# Patient Record
Sex: Female | Born: 1965 | Race: White | Hispanic: No | Marital: Single | Smoking: Never smoker
Health system: Southern US, Community
[De-identification: ages and names within clinical notes are randomized; demographics above are authoritative.]

## PROBLEM LIST (undated history)

## (undated) DIAGNOSIS — G43909 Migraine, unspecified, not intractable, without status migrainosus: Secondary | ICD-10-CM

## (undated) DIAGNOSIS — F419 Anxiety disorder, unspecified: Secondary | ICD-10-CM

## (undated) DIAGNOSIS — I10 Essential (primary) hypertension: Secondary | ICD-10-CM

## (undated) HISTORY — PX: BLADDER SUSPENSION: SHX72

## (undated) HISTORY — DX: Essential (primary) hypertension: I10

## (undated) HISTORY — PX: ABDOMINAL HYSTERECTOMY: SHX81

## (undated) HISTORY — DX: Migraine, unspecified, not intractable, without status migrainosus: G43.909

## (undated) HISTORY — DX: Anxiety disorder, unspecified: F41.9

## (undated) HISTORY — PX: WISDOM TOOTH EXTRACTION: SHX21

---

## 2004-12-05 ENCOUNTER — Emergency Department: Payer: Self-pay | Admitting: General Practice

## 2008-02-03 ENCOUNTER — Emergency Department: Payer: Self-pay | Admitting: Emergency Medicine

## 2008-10-15 ENCOUNTER — Ambulatory Visit: Payer: Self-pay | Admitting: Obstetrics and Gynecology

## 2008-10-21 ENCOUNTER — Ambulatory Visit: Payer: Self-pay | Admitting: Obstetrics and Gynecology

## 2011-03-25 ENCOUNTER — Other Ambulatory Visit: Payer: Self-pay | Admitting: Gastroenterology

## 2011-04-12 ENCOUNTER — Ambulatory Visit: Payer: Self-pay | Admitting: Neurology

## 2015-12-16 ENCOUNTER — Emergency Department: Payer: Worker's Compensation

## 2015-12-16 ENCOUNTER — Encounter: Payer: Self-pay | Admitting: Emergency Medicine

## 2015-12-16 ENCOUNTER — Emergency Department
Admission: EM | Admit: 2015-12-16 | Discharge: 2015-12-16 | Disposition: A | Discharge status: Home / Self Care | Payer: Worker's Compensation | Attending: Emergency Medicine | Admitting: Emergency Medicine

## 2015-12-16 DIAGNOSIS — Y939 Activity, unspecified: Secondary | ICD-10-CM | POA: Diagnosis not present

## 2015-12-16 DIAGNOSIS — Z79899 Other long term (current) drug therapy: Secondary | ICD-10-CM | POA: Insufficient documentation

## 2015-12-16 DIAGNOSIS — W19XXXA Unspecified fall, initial encounter: Secondary | ICD-10-CM

## 2015-12-16 DIAGNOSIS — S4991XA Unspecified injury of right shoulder and upper arm, initial encounter: Secondary | ICD-10-CM | POA: Insufficient documentation

## 2015-12-16 DIAGNOSIS — W010XXA Fall on same level from slipping, tripping and stumbling without subsequent striking against object, initial encounter: Secondary | ICD-10-CM | POA: Insufficient documentation

## 2015-12-16 DIAGNOSIS — Y929 Unspecified place or not applicable: Secondary | ICD-10-CM | POA: Diagnosis not present

## 2015-12-16 DIAGNOSIS — Y990 Civilian activity done for income or pay: Secondary | ICD-10-CM | POA: Diagnosis not present

## 2015-12-16 MED ORDER — IBUPROFEN 800 MG PO TABS: 800.0000 mg | ORAL_TABLET | Freq: Three times a day (TID) | ORAL | 0 refills | Status: DC | PRN

## 2015-12-16 NOTE — ED Notes (Signed)
See triage note. Slipped and landed on right arm  Having pain to right forearm  Positive pulses

## 2015-12-16 NOTE — ED Provider Notes (Signed)
[facility] Emergency Department Provider Note  ____________________________________________  Time seen: Approximately 10:06 AM  I have reviewed the triage vital signs and the nursing notes.   HISTORY  Chief Complaint Arm Injury    HPI Audrey Stewart is a 50 y.o. female resents for evaluation of right arm injury. Patient reports that she fell at work landing on her right lower forearm. Complaining of pain there.   History reviewed. No pertinent past medical history.  There are no active problems to display for this patient.   History reviewed. No pertinent surgical history.  Prior to Admission medications   Medication Sig Start Date End Date Taking? Authorizing Provider  atenolol (TENORMIN) 25 MG tablet Take 25 mg by mouth daily.   Yes Historical Provider, MD  PARoxetine (PAXIL) 30 MG tablet Take 30 mg by mouth daily.   Yes Historical Provider, MD  ibuprofen (ADVIL,MOTRIN) 800 MG tablet Take 1 tablet (800 mg total) by mouth every 8 (eight) hours as needed. 12/16/15   Audrey Dakinharles M Devi Hopman, PA-C    Allergies Review of patient's allergies indicates no known allergies.  History reviewed. No pertinent family history.  Social History Social History  Substance Use Topics  . Smoking status: Never Smoker  . Smokeless tobacco: Never Used  . Alcohol use No    Review of Systems Constitutional: No fever/chills Musculoskeletal: Right forearm pain. Skin: Negative for rash. Neurological: Negative for headaches, focal weakness or numbness.  10-point ROS otherwise negative.  ____________________________________________   PHYSICAL EXAM:  VITAL SIGNS: ED Triage Vitals  Enc Vitals Group     BP 12/16/15 0956 (!) 146/77     Pulse Rate 12/16/15 0956 64     Resp 12/16/15 0956 20     Temp 12/16/15 0956 98.2 F (36.8 C)     Temp Source 12/16/15 0956 Oral     SpO2 12/16/15 0956 100 %     Weight 12/16/15 0946 170 lb (77.1 kg)     Height 12/16/15 0946 5\' 7"   (1.702 m)     Head Circumference --      Peak Flow --      Pain Score 12/16/15 0946 6     Pain Loc --      Pain Edu? --      Excl. in GC? --     Constitutional: Alert and oriented. Well appearing and in no acute distress. Musculoskeletal: Point tenderness over the lateral aspect of the forearm. No ecchymosis or bruising noted. Distally neurovascularly intact. Good strength. Full range of motion with pronation supination. Neurologic:  Normal speech and language. No gross focal neurologic deficits are appreciated. No gait instability. Skin:  Skin is warm, dry and intact. No rash noted. Psychiatric: Mood and affect are normal. Speech and behavior are normal.  ____________________________________________   LABS (all labs ordered are listed, but only abnormal results are displayed)  Labs Reviewed - No data to display ____________________________________________  EKG   ____________________________________________  RADIOLOGY  IMPRESSION: No fracture or dislocation. No apparent arthropathy. ____________________________________________   PROCEDURES  Procedure(s) performed: None  Critical Care performed: No  ____________________________________________   INITIAL IMPRESSION / ASSESSMENT AND PLAN / ED COURSE  Pertinent labs & imaging results that were available during my care of the patient were reviewed by me and considered in my medical decision making (see chart for details). Review of the [place] CSRS was performed in accordance of the [facility] prior to dispensing any controlled drugs.  Status post fall with right forearm contusion.  Rx given for Motrin 800 mg 3 times a day as needed for pain or discomfort. Return to work tomorrow.  Clinical Course    ____________________________________________   FINAL CLINICAL IMPRESSION(S) / ED DIAGNOSES  Final diagnoses:  Fall, initial encounter  Arm injury, right, initial encounter     This chart was dictated using voice  recognition software/Dragon. Despite best efforts to proofread, errors can occur which can change the meaning. Any change was purely unintentional.    Audrey Dakinharles M Goldy Calandra, PA-C 12/16/15 1058    Governor Rooksebecca Lord, MD 12/16/15 1620

## 2015-12-16 NOTE — ED Triage Notes (Signed)
Pt comes into the ED via EMS from work at [employer], states she slipped and landed on her right arm.. Pt c/o right forearm pain.. Pt arrived with a sling on ..Marland Kitchen

## 2020-01-17 ENCOUNTER — Ambulatory Visit
Admission: RE | Admit: 2020-01-17 | Discharge: 2020-01-17 | Disposition: A | Discharge status: Home / Self Care | Payer: 59 | Source: Ambulatory Visit | Attending: Emergency Medicine | Admitting: Emergency Medicine

## 2020-01-17 ENCOUNTER — Ambulatory Visit
Admission: EM | Admit: 2020-01-17 | Discharge: 2020-01-17 | Disposition: A | Discharge status: Home / Self Care | Payer: 59 | Attending: Emergency Medicine | Admitting: Emergency Medicine

## 2020-01-17 ENCOUNTER — Ambulatory Visit
Admission: RE | Admit: 2020-01-17 | Discharge: 2020-01-17 | Disposition: A | Discharge status: Home / Self Care | Payer: 59 | Attending: Emergency Medicine | Admitting: Emergency Medicine

## 2020-01-17 ENCOUNTER — Other Ambulatory Visit: Payer: Self-pay

## 2020-01-17 DIAGNOSIS — R0602 Shortness of breath: Secondary | ICD-10-CM

## 2020-01-17 DIAGNOSIS — R05 Cough: Secondary | ICD-10-CM | POA: Diagnosis not present

## 2020-01-17 DIAGNOSIS — U071 COVID-19: Secondary | ICD-10-CM | POA: Insufficient documentation

## 2020-01-17 DIAGNOSIS — J189 Pneumonia, unspecified organism: Secondary | ICD-10-CM

## 2020-01-17 DIAGNOSIS — R059 Cough, unspecified: Secondary | ICD-10-CM

## 2020-01-17 MED ORDER — AZITHROMYCIN 250 MG PO TABS: 250.0000 mg | ORAL_TABLET | Freq: Every day | ORAL | 0 refills | Status: AC

## 2020-01-17 NOTE — ED Triage Notes (Signed)
Patient reports she tested positive for COVID with an at home rapid test; agreeable to having a send off today. Reports she feels the chest congestion is getting worse.

## 2020-01-17 NOTE — Discharge Instructions (Signed)
Go to [facility] for your chest xray.  I will call you with the results afterward.    Go to the Emergency Department if you have acute worsening symptoms, including shortness of breath.

## 2020-01-17 NOTE — ED Provider Notes (Signed)
Audrey Stewart    CSN: [id number] Arrival date & time: 01/17/20  1029      History   Chief Complaint Chief Complaint  Patient presents with  . Covid Positive  . Chest Congestion    HPI Audrey Stewart is a 54 y.o. female.   Patient presents with nonproductive cough and shortness of breath x2 weeks.  She reports a positive COVID test at home on January 07, 2020.  She feels like her chest congestion is worsening.  She denies fever, chills, sore throat, vomiting, diarrhea, or other symptoms.  Treatment attempted at home with [facility] and Robitussin.  The history is provided by the patient.    Past Medical History:  Diagnosis Date  . Anxiety   . Hypertension   . Migraine     There are no problems to display for this patient.   Past Surgical History:  Procedure Laterality Date  . ABDOMINAL HYSTERECTOMY    . BLADDER SUSPENSION    . WISDOM TOOTH EXTRACTION      OB History   No obstetric history on file.      Home Medications    Prior to Admission medications   Medication Sig Start Date End Date Taking? Authorizing Provider  atenolol (TENORMIN) 25 MG tablet Take 25 mg by mouth daily.   Yes [provider]  PARoxetine (PAXIL) 30 MG tablet Take 30 mg by mouth daily.   Yes [provider]  azithromycin (ZITHROMAX) 250 MG tablet Take 1 tablet (250 mg total) by mouth daily. Take first 2 tablets together, then 1 every day until finished. 01/17/20   Mickie Bail, NP  ibuprofen (ADVIL,MOTRIN) 800 MG tablet Take 1 tablet (800 mg total) by mouth every 8 (eight) hours as needed. 12/16/15   Beers, Charmayne Sheer, PA-C    Family History History reviewed. No pertinent family history.  Social History Social History   Tobacco Use  . Smoking status: Never Smoker  . Smokeless tobacco: Never Used  Substance Use Topics  . Alcohol use: No  . Drug use: No     Allergies   Patient has no known allergies.   Review of Systems Review of Systems    Constitutional: Negative for chills and fever.  HENT: Negative for ear pain and sore throat.   Eyes: Negative for pain and visual disturbance.  Respiratory: Positive for cough and shortness of breath.   Cardiovascular: Negative for chest pain and palpitations.  Gastrointestinal: Negative for abdominal pain, diarrhea and vomiting.  Genitourinary: Negative for dysuria and hematuria.  Musculoskeletal: Negative for arthralgias and back pain.  Skin: Negative for color change and rash.  Neurological: Negative for seizures and syncope.  All other systems reviewed and are negative.    Physical Exam Triage Vital Signs ED Triage Vitals  Enc Vitals Group     BP      Pulse      Resp      Temp      Temp src      SpO2      Weight      Height      Head Circumference      Peak Flow      Pain Score      Pain Loc      Pain Edu?      Excl. in GC?    No data found.  Updated Vital Signs BP (!) 149/89   Pulse 67   Temp 98.9 F (37.2 C)  Resp 20   SpO2 95%   Visual Acuity Right Eye Distance:   Left Eye Distance:   Bilateral Distance:    Right Eye Near:   Left Eye Near:    Bilateral Near:     Physical Exam Vitals and nursing note reviewed.  Constitutional:      General: She is not in acute distress.    Appearance: She is well-developed.  HENT:     Head: Normocephalic and atraumatic.     Mouth/Throat:     Mouth: Mucous membranes are moist.     Pharynx: Oropharynx is clear.  Eyes:     Conjunctiva/sclera: Conjunctivae normal.  Cardiovascular:     Rate and Rhythm: Normal rate and regular rhythm.     Heart sounds: No murmur heard.   Pulmonary:     Effort: Pulmonary effort is normal. No respiratory distress.     Breath sounds: Normal breath sounds. No wheezing or rhonchi.  Abdominal:     Palpations: Abdomen is soft.     Tenderness: There is no abdominal tenderness. There is no guarding or rebound.  Musculoskeletal:     Cervical back: Neck supple.  Skin:    General:  Skin is warm and dry.     Findings: No rash.  Neurological:     General: No focal deficit present.     Mental Status: She is alert and oriented to person, place, and time.     Gait: Gait normal.  Psychiatric:        Mood and Affect: Mood normal.        Behavior: Behavior normal.      UC Treatments / Results  Labs (all labs ordered are listed, but only abnormal results are displayed) Labs Reviewed  NOVEL CORONAVIRUS, NAA    EKG   Radiology DG Chest 2 View  Result Date: 01/17/2020 CLINICAL DATA:  COVID positive.  Cough. EXAM: CHEST - 2 VIEW COMPARISON:  None. FINDINGS: Lung volume normal. Possible subtle early areas of airspace disease right upper lobe and left lung base. No pleural effusion. Cardiac and mediastinal contours normal. Skeletal structures normal. IMPRESSION: Question early subtle areas of airspace disease bilaterally which could be due to COVID pneumonia. Electronically Signed   By: Marlan Palau M.D.   On: 01/17/2020 11:45    Procedures Procedures (including critical care time)  Medications Ordered in UC Medications - No data to display  Initial Impression / Assessment and Plan / UC Course  I have reviewed the triage vital signs and the nursing notes.  Pertinent labs & imaging results that were available during my care of the patient were reviewed by me and considered in my medical decision making (see chart for details).   COVID-19.  Early pneumonia.  Chest xray "Possible subtle early areas of airspace disease right upper lobe and left lung base.... Question early subtle areas of airspace disease bilaterally which could be due to COVID pneumonia."  Treating with Zithromax.  Strict instruction for going to the ED discussed with patient, including increased SOB.  Instructed patient to follow up with her PCP in 1-2 weeks for a recheck.  Patient agrees to plan of care.     Final Clinical Impressions(s) / UC Diagnoses   Final diagnoses:  COVID-19  Cough    Shortness of breath  Community acquired pneumonia, unspecified laterality     Discharge Instructions     Go to [facility] for your chest xray.  I will call you with the  results afterward.    Go to the Emergency Department if you have acute worsening symptoms, including shortness of breath.        ED Prescriptions    Medication Sig Dispense Auth. Provider   azithromycin (ZITHROMAX) 250 MG tablet Take 1 tablet (250 mg total) by mouth daily. Take first 2 tablets together, then 1 every day until finished. 6 tablet Mickie Bail, NP     PDMP not reviewed this encounter.   Mickie Bail, NP 01/17/20 [phone]

## 2020-01-19 LAB — NOVEL CORONAVIRUS, NAA: SARS-CoV-2, NAA: DETECTED — AB

## 2020-01-19 LAB — SARS-COV-2, NAA 2 DAY TAT

## 2020-01-20 ENCOUNTER — Emergency Department: Payer: 59

## 2020-01-20 ENCOUNTER — Encounter: Payer: Self-pay | Admitting: Emergency Medicine

## 2020-01-20 ENCOUNTER — Emergency Department
Admission: EM | Admit: 2020-01-20 | Discharge: 2020-01-20 | Disposition: A | Discharge status: Home / Self Care | Payer: 59 | Attending: Emergency Medicine | Admitting: Emergency Medicine

## 2020-01-20 ENCOUNTER — Other Ambulatory Visit: Payer: Self-pay

## 2020-01-20 DIAGNOSIS — I10 Essential (primary) hypertension: Secondary | ICD-10-CM | POA: Insufficient documentation

## 2020-01-20 DIAGNOSIS — R059 Cough, unspecified: Secondary | ICD-10-CM

## 2020-01-20 DIAGNOSIS — R0602 Shortness of breath: Secondary | ICD-10-CM

## 2020-01-20 DIAGNOSIS — U071 COVID-19: Secondary | ICD-10-CM | POA: Diagnosis not present

## 2020-01-20 DIAGNOSIS — R432 Parageusia: Secondary | ICD-10-CM

## 2020-01-20 DIAGNOSIS — R05 Cough: Secondary | ICD-10-CM | POA: Diagnosis present

## 2020-01-20 DIAGNOSIS — Z79899 Other long term (current) drug therapy: Secondary | ICD-10-CM | POA: Diagnosis not present

## 2020-01-20 LAB — BASIC METABOLIC PANEL
Anion gap: 13 (ref 5–15)
BUN: 14 mg/dL (ref 6–20)
CO2: 25 mmol/L (ref 22–32)
Calcium: 8.5 mg/dL — ABNORMAL LOW (ref 8.9–10.3)
Chloride: 100 mmol/L (ref 98–111)
Creatinine, Ser: 0.87 mg/dL (ref 0.44–1.00)
GFR calc Af Amer: >60 mL/min (ref >60)
GFR calc non Af Amer: >60 mL/min (ref >60)
Glucose, Bld: 123 mg/dL — ABNORMAL HIGH (ref 70–99)
Potassium: 3.4 mmol/L — ABNORMAL LOW (ref 3.5–5.1)
Sodium: 138 mmol/L (ref 135–145)

## 2020-01-20 LAB — CBC
HCT: 38.6 % (ref 36.0–46.0)
Hemoglobin: 13.3 g/dL (ref 12.0–15.0)
MCH: 29.6 pg (ref 26.0–34.0)
MCHC: 34.5 g/dL (ref 30.0–36.0)
MCV: 86 fL (ref 80.0–100.0)
Platelets: 276 10*3/uL (ref 150–400)
RBC: 4.49 MIL/uL (ref 3.87–5.11)
RDW: 12.6 % (ref 11.5–15.5)
WBC: 8.9 10*3/uL (ref 4.0–10.5)
nRBC: 0 % (ref 0.0–0.2)

## 2020-01-20 LAB — TROPONIN I (HIGH SENSITIVITY)
Troponin I (High Sensitivity): 5 ng/L (ref <18)
Troponin I (High Sensitivity): 3 ng/L (ref <18)

## 2020-01-20 MED ORDER — IOHEXOL 350 MG/ML SOLN
75.0000 mL | Freq: Once | INTRAVENOUS | Status: AC | PRN
  Administered 2020-01-20: 75 mL via INTRAVENOUS

## 2020-01-20 MED ORDER — SODIUM CHLORIDE 0.9 % IV BOLUS
1000.0000 mL | Freq: Once | INTRAVENOUS | Status: AC
  Administered 2020-01-20: 1000 mL via INTRAVENOUS

## 2020-01-20 NOTE — ED Triage Notes (Addendum)
Pt arrived via POV with reports of shortness of breath, cough and congestion. Pt reports she did a home COVID test on 9/6 that was positive, was seen Thursday dx with PNA and had another test that was positive.  Pt states she is having trouble laying down due to cough and shortness of breath.  Pt reports she has been running fevers but does not remember how high but states she has been taking tylenol.  Pt currently taking zpak and has 2 doses left

## 2020-01-20 NOTE — Discharge Instructions (Addendum)
Please return to the emergency department for any episodes of hypoxia (oxygen level<88%)

## 2020-01-20 NOTE — ED Provider Notes (Signed)
[facility] Emergency Department Provider Note   ____________________________________________   First MD Initiated Contact with Patient 01/20/20 1122     (approximate)  I have reviewed the triage vital signs and the nursing notes.   HISTORY  Chief Complaint Cough, Nasal Congestion, and Shortness of Breath    HPI Audrey Stewart is a 54 y.o. female with a stated past medical history of anxiety, hypertension, and migraines who presents for worsening shortness of breath in the setting of of COVID-19 infection.  Patient states that she took a home Covid test on 9/6 that was positive and then seen on the subsequent Thursday and diagnosed with pneumonia and another positive test.  Patient states that she is having worsening shortness of breath over the last 10 days that is worsened when laying down flat and partially relieved by ice sitting or standing.  Patient also endorses associated fevers for which she has been taking Tylenol.  Patient also states that she has taken a 5-day course of prednisone taper as well as currently taking a Z-Pak with 2 doses left.         Past Medical History:  Diagnosis Date  . Anxiety   . Hypertension   . Migraine     There are no problems to display for this patient.   Past Surgical History:  Procedure Laterality Date  . ABDOMINAL HYSTERECTOMY    . BLADDER SUSPENSION    . WISDOM TOOTH EXTRACTION      Prior to Admission medications   Medication Sig Start Date End Date Taking? Authorizing Provider  atenolol (TENORMIN) 25 MG tablet Take 25 mg by mouth daily.    [provider]  azithromycin (ZITHROMAX) 250 MG tablet Take 1 tablet (250 mg total) by mouth daily. Take first 2 tablets together, then 1 every day until finished. 01/17/20   Mickie Bail, NP  ibuprofen (ADVIL,MOTRIN) 800 MG tablet Take 1 tablet (800 mg total) by mouth every 8 (eight) hours as needed. 12/16/15   Beers, Charmayne Sheer, PA-C  PARoxetine (PAXIL)  30 MG tablet Take 30 mg by mouth daily.    [provider]    Allergies Scopolamine  No family history on file.  Social History Social History   Tobacco Use  . Smoking status: Never Smoker  . Smokeless tobacco: Never Used  Substance Use Topics  . Alcohol use: No  . Drug use: No    Review of Systems Constitutional: Endorses fever/chills Eyes: No visual changes. ENT: Endorses sore throat. Cardiovascular: Endorses chest pain. Respiratory: Endorses shortness of breath and cough Gastrointestinal: No abdominal pain.  No nausea, no vomiting.  No diarrhea. Genitourinary: Negative for dysuria. Musculoskeletal: Negative for acute arthralgias Skin: Negative for rash. Neurological: Negative for headaches, weakness/numbness/paresthesias in any extremity Psychiatric: Negative for suicidal ideation/homicidal ideation   ____________________________________________   PHYSICAL EXAM:  VITAL SIGNS: ED Triage Vitals  Enc Vitals Group     BP 01/20/20 0526 124/64     Pulse Rate 01/20/20 0526 82     Resp 01/20/20 0526 18     Temp 01/20/20 0526 98.5 F (36.9 C)     Temp Source 01/20/20 0526 Oral     SpO2 01/20/20 0526 97 %     Weight 01/20/20 0532 160 lb (72.6 kg)     Height 01/20/20 0532 5\' 6"  (1.676 m)     Head Circumference --      Peak Flow --      Pain Score 01/20/20 0532 8  Pain Loc --      Pain Edu? --      Excl. in GC? --    Constitutional: Alert and oriented. Well appearing and in no acute distress. Eyes: Conjunctivae are normal. PERRL. EOMI. Head: Atraumatic. Nose: No congestion/rhinnorhea. Mouth/Throat: Mucous membranes are moist.  Erythematous posterior oropharynx Neck: No stridor Cardiovascular: Normal rate, regular rhythm. Grossly normal heart sounds.  Good peripheral circulation. Respiratory: Rhonchi over bilateral lung fields, mildly tachypneic.  Normal respiratory effort.  No retractions. Gastrointestinal: Soft and nontender. No  distention. Musculoskeletal: No lower extremity tenderness nor edema.  No joint effusions. Neurologic:  Normal speech and language. No gross focal neurologic deficits are appreciated. Skin:  Skin is warm and dry. No rash noted. Psychiatric: Mood and affect are normal. Speech and behavior are normal.  ____________________________________________   LABS (all labs ordered are listed, but only abnormal results are displayed)  Labs Reviewed  BASIC METABOLIC PANEL - Abnormal; Notable for the following components:      Result Value   Potassium 3.4 (*)    Glucose, Bld 123 (*)    Calcium 8.5 (*)    All other components within normal limits  CBC  TROPONIN I (HIGH SENSITIVITY)  TROPONIN I (HIGH SENSITIVITY)   ____________________________________________  EKG  ED ECG REPORT I, Merwyn Katos, the attending physician, personally viewed and interpreted this ECG.  Date: 01/20/2020 EKG Time: 523 Rate: 73 Rhythm: normal sinus rhythm QRS Axis: normal Intervals: normal ST/T Wave abnormalities: normal Narrative Interpretation: no evidence of acute ischemia  ____________________________________________  RADIOLOGY  ED MD interpretation: CT angiography of the chest as well as 2 view x-ray shows scattered bibasilar opacities concerning for multifocal pneumonia likely related to Covid.  No evidence of pulmonary emboli  Official radiology report(s): DG Chest 2 View  Result Date: 01/20/2020 CLINICAL DATA:  COVID-19 EXAM: CHEST - 2 VIEW COMPARISON:  01/17/2020 FINDINGS: Worsened multifocal bilateral peripheral predominant airspace opacities. No pleural effusion. No pneumothorax. Normal cardiomediastinal contours. IMPRESSION: Worsened multifocal bilateral peripheral predominant airspace opacities, compatible with multifocal infection. Electronically Signed   By: Deatra Robinson M.D.   On: 01/20/2020 06:35    ____________________________________________   PROCEDURES  Procedure(s) performed  (including Critical Care):  Procedures   ____________________________________________   INITIAL IMPRESSION / ASSESSMENT AND PLAN / ED COURSE        Presentation most consistent with Viral Syndrome.  Patient has tested positive for COVID-19. Based on vitals and exam they are not hypoxic, nontoxic and stable for discharge.  Given History and Exam I have a lower suspicion for: Emergent CardioPulmonary causes [such as Acute Asthma or COPD Exacerbation, acute Heart Failure or exacerbation, PE, PTX, atypical ACS, PNA]. Emergent Otolaryngeal causes [such as PTA, RPA, Ludwigs, Epiglottitis, EBV].  Regarding Emergent Travel or Immunosuppressive related infectious: I have a low suspicion for acute HIV.  Will provide strict return precautions and instructions on self-isolation/quarantine and anticipatory guidance.      ____________________________________________   FINAL CLINICAL IMPRESSION(S) / ED DIAGNOSES  Final diagnoses:  None     ED Discharge Orders    None       Note:  This document was prepared using Dragon voice recognition software and may include unintentional dictation errors.   Merwyn Katos, MD 01/20/20 1446

## 2020-01-20 NOTE — ED Notes (Signed)
Family updated. Pending CTA results.

## 2020-01-21 ENCOUNTER — Telehealth: Payer: Self-pay | Admitting: Physician Assistant

## 2020-01-21 NOTE — Telephone Encounter (Signed)
  Called to discuss with patient about Covid symptoms and the use of casirivimab/imdevimab, a monoclonal antibody infusion for those with mild to moderate Covid symptoms and at a high risk of hospitalization.    Message left to call back our hotline [phone] and sent my chart message.   Deone Omahoney, PA - C 

## 2021-07-03 ENCOUNTER — Emergency Department
Admission: EM | Admit: 2021-07-03 | Discharge: 2021-07-03 | Disposition: A | Discharge status: Home / Self Care | Payer: 59 | Attending: Emergency Medicine | Admitting: Emergency Medicine

## 2021-07-03 ENCOUNTER — Encounter: Payer: Self-pay | Admitting: Emergency Medicine

## 2021-07-03 ENCOUNTER — Emergency Department: Payer: 59

## 2021-07-03 ENCOUNTER — Other Ambulatory Visit: Payer: Self-pay

## 2021-07-03 DIAGNOSIS — Z79899 Other long term (current) drug therapy: Secondary | ICD-10-CM | POA: Diagnosis not present

## 2021-07-03 DIAGNOSIS — R079 Chest pain, unspecified: Secondary | ICD-10-CM | POA: Diagnosis present

## 2021-07-03 DIAGNOSIS — K2900 Acute gastritis without bleeding: Secondary | ICD-10-CM | POA: Insufficient documentation

## 2021-07-03 DIAGNOSIS — I10 Essential (primary) hypertension: Secondary | ICD-10-CM | POA: Insufficient documentation

## 2021-07-03 LAB — TROPONIN I (HIGH SENSITIVITY)
Troponin I (High Sensitivity): 3 ng/L (ref <18)
Troponin I (High Sensitivity): 4 ng/L (ref <18)

## 2021-07-03 LAB — BASIC METABOLIC PANEL
Anion gap: 10 (ref 5–15)
BUN: 14 mg/dL (ref 6–20)
CO2: 29 mmol/L (ref 22–32)
Calcium: 9.7 mg/dL (ref 8.9–10.3)
Chloride: 99 mmol/L (ref 98–111)
Creatinine, Ser: 0.94 mg/dL (ref 0.44–1.00)
GFR, Estimated: >60 mL/min (ref >60)
Glucose, Bld: 86 mg/dL (ref 70–99)
Potassium: 3.5 mmol/L (ref 3.5–5.1)
Sodium: 138 mmol/L (ref 135–145)

## 2021-07-03 LAB — CBC
HCT: 43.1 % (ref 36.0–46.0)
Hemoglobin: 14.1 g/dL (ref 12.0–15.0)
MCH: 29 pg (ref 26.0–34.0)
MCHC: 32.7 g/dL (ref 30.0–36.0)
MCV: 88.7 fL (ref 80.0–100.0)
Platelets: 307 10*3/uL (ref 150–400)
RBC: 4.86 MIL/uL (ref 3.87–5.11)
RDW: 12.1 % (ref 11.5–15.5)
WBC: 9 10*3/uL (ref 4.0–10.5)
nRBC: 0 % (ref 0.0–0.2)

## 2021-07-03 MED ORDER — FAMOTIDINE 20 MG PO TABS: 20.0000 mg | ORAL_TABLET | Freq: Two times a day (BID) | ORAL | 0 refills | Status: AC

## 2021-07-03 NOTE — ED Triage Notes (Signed)
Pt in via POV, reports recent new medication for hypertension, reports has been keeping a log at home, today BP 179/105, also reports left arm pain, some intermittent chest pain as well with radiation to jaw.   ? ?Sent over per [facility] for further eval.  NAD noted at this time.  ?

## 2021-07-03 NOTE — ED Provider Triage Note (Signed)
Emergency Medicine Provider Triage Evaluation Note ? ?Audrey Stewart , a 56 y.o. female  was evaluated in triage.  Pt complains of blurred vision and left arm pain plus hypertension. She was taken off Atenolol due to decreased energy and started Lisinopril about a month ago. Has been keeping a log of BP readings and has been hypertensive according to her home machine. ? ?Review of Systems  ?Positive: Left arm pain, blurred vision ?Negative: Chest pain, shortness of breath ? ?Physical Exam  ?BP 135/81 (BP Location: Left Arm)   Pulse 61   Resp 16   SpO2 98%  ?Gen:   Awake, no distress   ?Resp:  Normal effort  ?MSK:   Moves extremities without difficulty  ?Other:   ? ?Medical Decision Making  ?Medically screening exam initiated at 2:31 PM.  Appropriate orders placed.  Audrey Stewart was informed that the remainder of the evaluation will be completed by another provider, this initial triage assessment does not replace that evaluation, and the importance of remaining in the ED until their evaluation is complete. ? ?  ?Chinita Pester, FNP ?07/03/21 1436 ? ?

## 2021-07-03 NOTE — ED Triage Notes (Signed)
Sent from [facility] for ED evaluation.  Patient C?O CP on Wendesday night that returned today.  Also c/o left arm pain and elevated BP.  BP  150's/80 at [facility].  AAOx3.  Skin warm and dry. NAD ?

## 2021-07-03 NOTE — ED Provider Notes (Signed)
? ?[facility] ?Provider Note ? ? ? Event Date/Time  ? First MD Initiated Contact with Patient 07/03/21 1752   ?  (approximate) ? ? ?History  ? ?Hypertension ? ? ?HPI ? ?Audrey Stewart is a 56 y.o. female with a past history of hypertension and migraines and anxiety who comes ED complaining of intermittent chest pain that radiates up to the jaw and left arm.  Not exertional, not pleuritic, no aggravating or alleviating factors.  Not worse with eating.  No fever or shortness of breath.  No diaphoresis or vomiting.  No black or bloody stool.  Went to urgent care for evaluation but they sent the patient to the ED. ? ?She does note that with blood pressure, she was recently transition from atenolol to 10 mg lisinopril by her gynecologist, and blood pressure has been running higher since then.  They did send a prescription to increase lisinopril to 20 mg today which she has not picked up yet. ?  ? ? ?Physical Exam  ? ?Triage Vital Signs: ?ED Triage Vitals  ?Enc Vitals Group  ?   BP 07/03/21 1428 135/81  ?   Pulse Rate 07/03/21 1428 61  ?   Resp 07/03/21 1428 16  ?   Temp 07/03/21 1428 98 ?F (36.7 ?C)  ?   Temp Source 07/03/21 1428 Oral  ?   SpO2 07/03/21 1428 98 %  ?   Weight 07/03/21 1432 173 lb (78.5 kg)  ?   Height 07/03/21 1432 5\' 6"  (1.676 m)  ?   Head Circumference --   ?   Peak Flow --   ?   Pain Score 07/03/21 1431 3  ?   Pain Loc --   ?   Pain Edu? --   ?   Excl. in GC? --   ? ? ?Most recent vital signs: ?Vitals:  ? 07/03/21 1428 07/03/21 1805  ?BP: 135/81 (!) 165/89  ?Pulse: 61 (!) 56  ?Resp: 16 17  ?Temp: 98 ?F (36.7 ?C)   ?SpO2: 98% 100%  ? ? ? ?General: Awake, no distress.  ?CV:  Good peripheral perfusion.  Regular rate and rhythm.  Symmetric peripheral pulses ?Resp:  Normal effort.  Clear to auscultation bilaterally ?Abd:  No distention.  Mild left upper quadrant tenderness reproducing her pain.  No peritoneal signs.  Abdomen is soft ?Other:  No lower extremity edema or calf swelling  or calf tenderness. ? ? ?ED Results / Procedures / Treatments  ? ?Labs ?(all labs ordered are listed, but only abnormal results are displayed) ?Labs Reviewed  ?BASIC METABOLIC PANEL  ?CBC  ?TROPONIN I (HIGH SENSITIVITY)  ?TROPONIN I (HIGH SENSITIVITY)  ? ? ? ?EKG ? ?Interpreted by me ?Normal sinus rhythm rate of 61.  Normal axis intervals QRS ST segments and T waves.  No evidence of right heart strain or ischemic changes. ? ? ?RADIOLOGY ?Chest x-ray viewed and interpreted by me, appears normal.  Radiology report reviewed. ? ? ? ?PROCEDURES: ? ?Critical Care performed: No ? ?Procedures ? ? ?MEDICATIONS ORDERED IN ED: ?Medications - No data to display ? ? ?IMPRESSION / MDM / ASSESSMENT AND PLAN / ED COURSE  ?I reviewed the triage vital signs and the nursing notes. ?             ?               ? ?Differential diagnosis includes, but is not limited to, GERD, non-STEMI, anxiety, pneumonia ? ? ? ?  Patient presents with atypical chest pain, has normal vital signs, normal labs and EKG and chest x-ray.  Exam is consistent with gastritis.  We will start her on Pepcid, agree with increasing lisinopril which she will pick up today.  Not requiring admission with atypical symptoms and reassuring work-up.  Doubt ACS PE dissection pericarditis or mediastinitis.  Stable for discharge. ?  ? ? ?FINAL CLINICAL IMPRESSION(S) / ED DIAGNOSES  ? ?Final diagnoses:  ?Acute gastritis without hemorrhage, unspecified gastritis type  ?Hypertension, unspecified type  ? ? ? ?Rx / DC Orders  ? ?ED Discharge Orders   ? ?      Ordered  ?  famotidine (PEPCID) 20 MG tablet  2 times daily       ? 07/03/21 1909  ? ?  ?  ? ?  ? ? ? ?Note:  This document was prepared using Dragon voice recognition software and may include unintentional dictation errors. ?  ?Sharman Cheek, MD ?07/03/21 1915 ? ?

## 2022-03-03 IMAGING — CT CT ANGIO CHEST
2 of 6 series · 18 of 46 positions shown · IV contrast (APPLIED)
Comparison: None.

CLINICAL DATA: Shortness of breath. FXURI-ZA positive. Cough and
congestion.

EXAM:
CT ANGIOGRAPHY CHEST WITH CONTRAST
TECHNIQUE: Multidetector CT imaging of the chest was performed using the
standard protocol during bolus administration of intravenous
contrast. Multiplanar CT image reconstructions and MIPs were
obtained to evaluate the vascular anatomy.
CONTRAST:  75mL OMNIPAQUE IOHEXOL 350 MG/ML SOLN

[Series 5: thins · axial · 0.72mm/px · z∈[-649,-399]mm · 15 of 274 slices shown]
[im 12/274  lung]
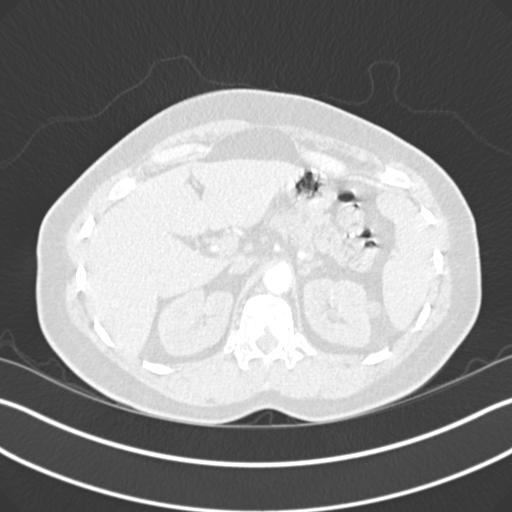
[im 36/274  soft-tissue]
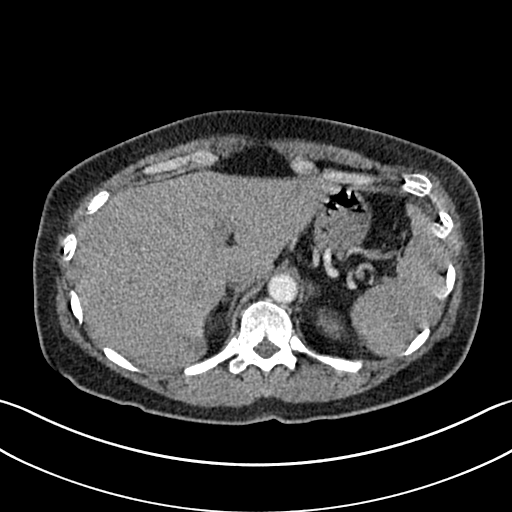
[im 48/274  lung]
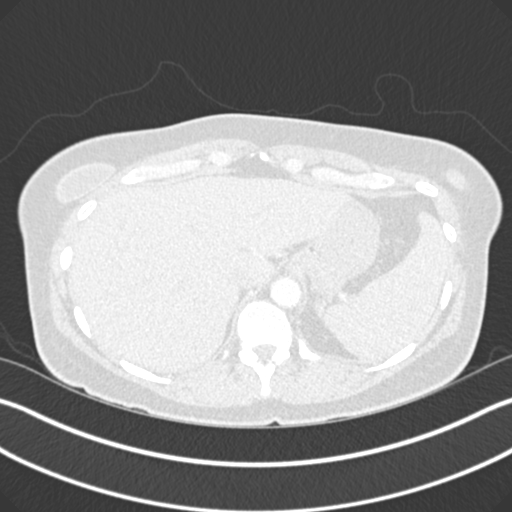
[im 72/274  soft-tissue]
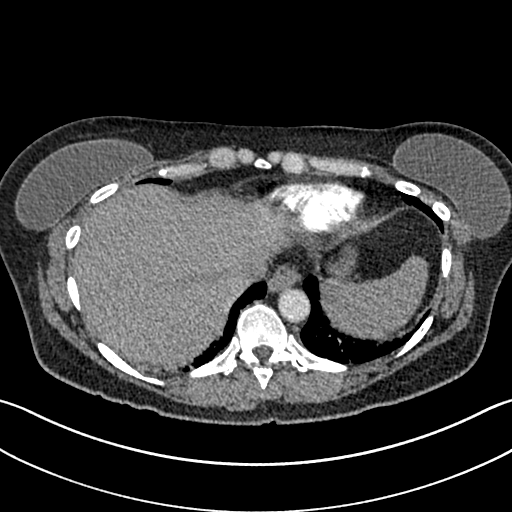
[im 84/274  lung]
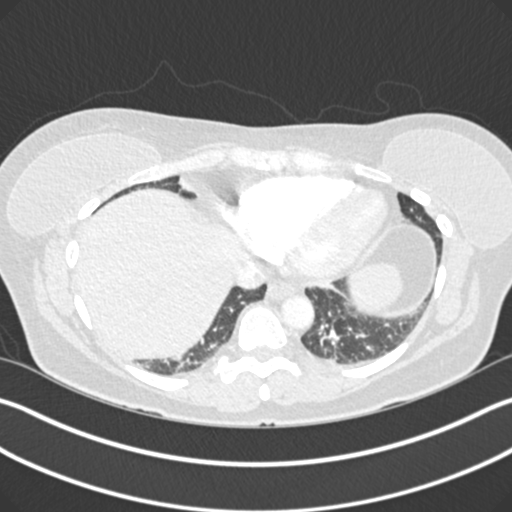
[im 107/274  soft-tissue]
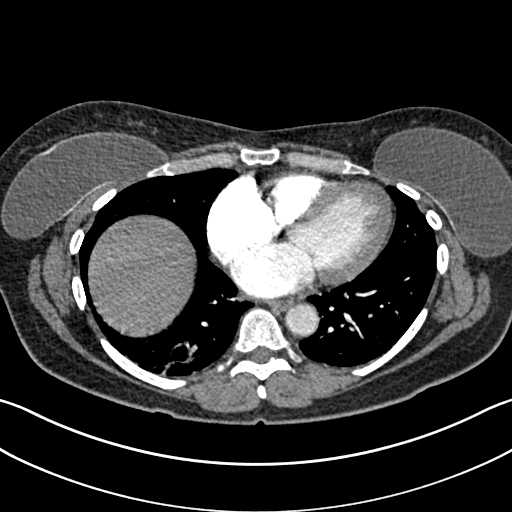
[im 119/274  lung]
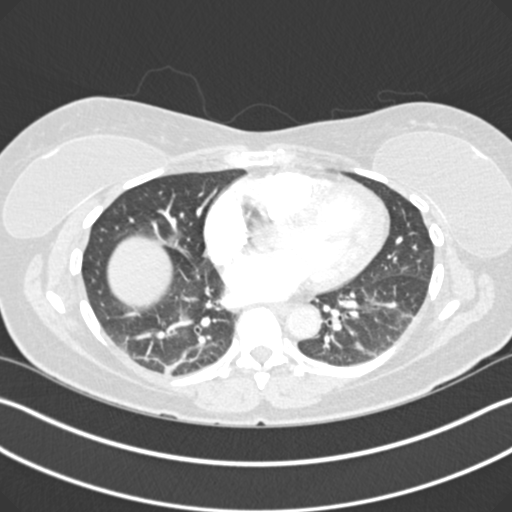
[im 143/274  soft-tissue]
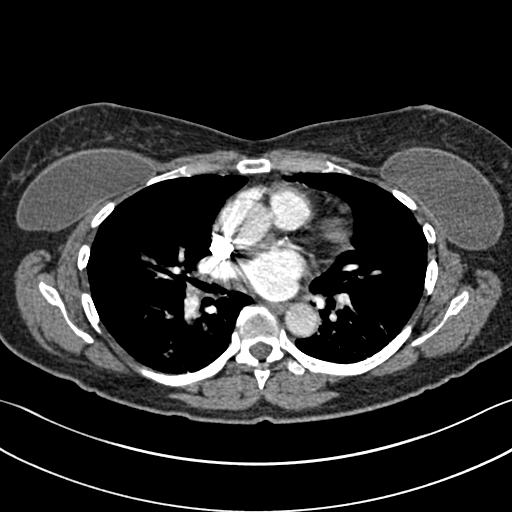
[im 155/274  lung]
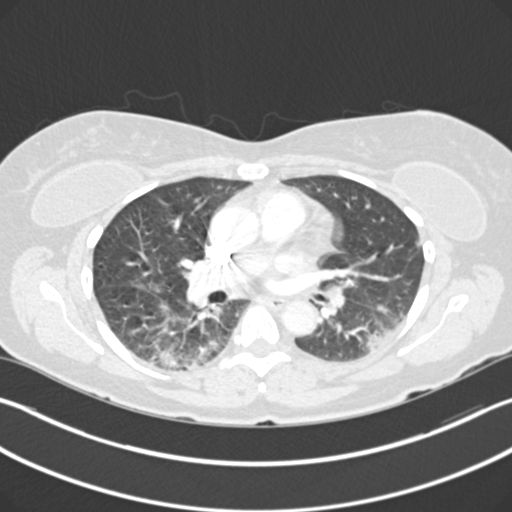
[im 167/274  soft-tissue]
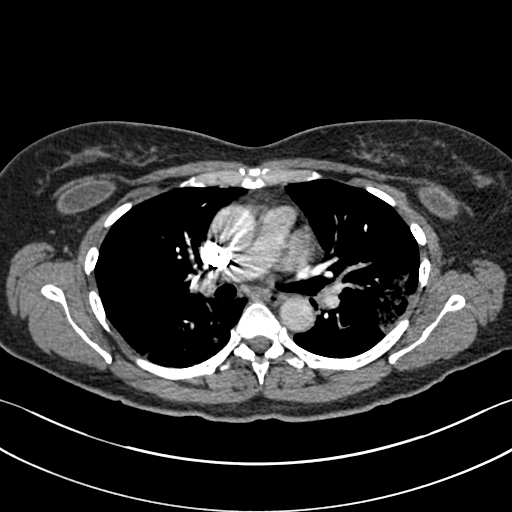
[im 190/274  lung]
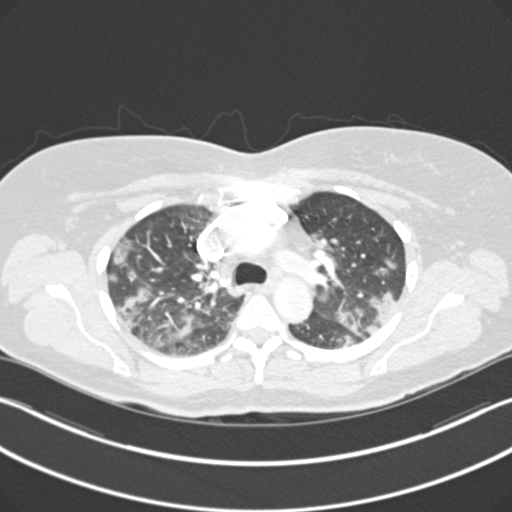
[im 202/274  soft-tissue]
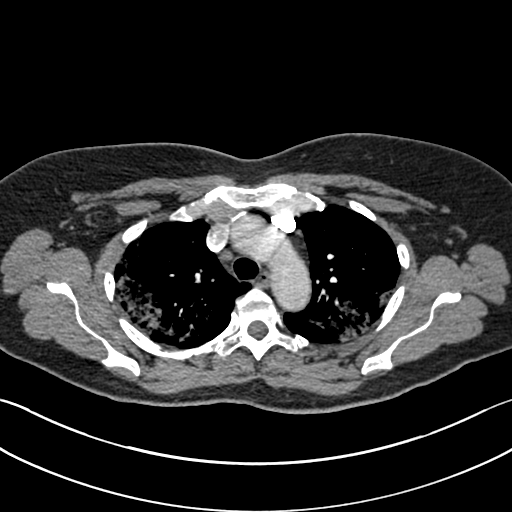
[im 226/274  lung]
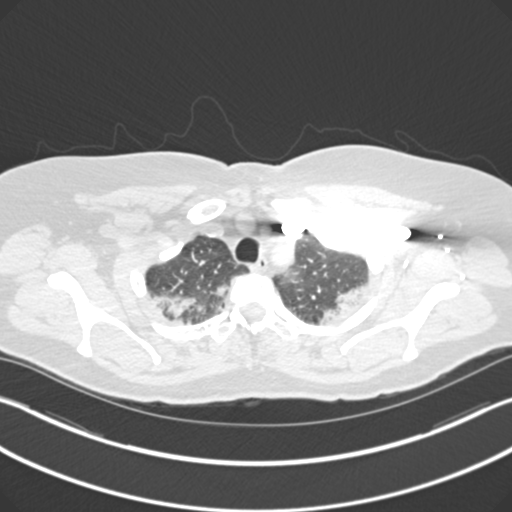
[im 238/274  soft-tissue]
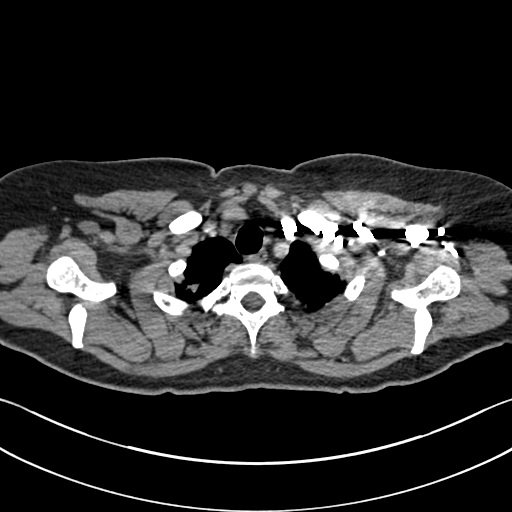
[im 262/274  lung]
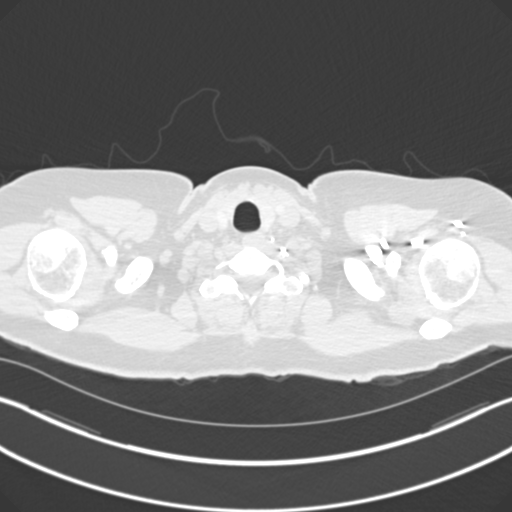

[Series 7: coronal mpr · coronal · 0.53mm/px · 3 of 86 slices shown]
[im 22/86  soft-tissue]
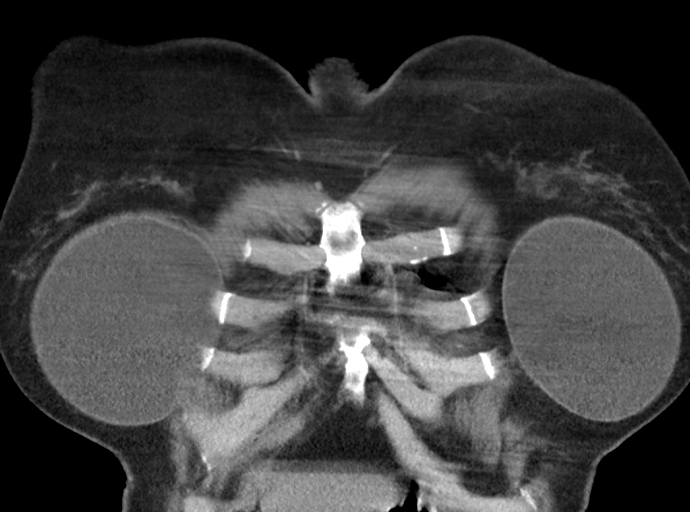
[im 43/86  soft-tissue]
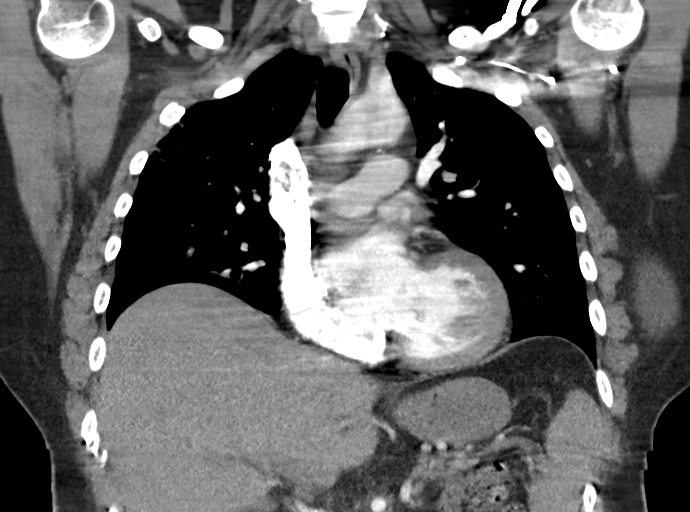
[im 64/86  soft-tissue]
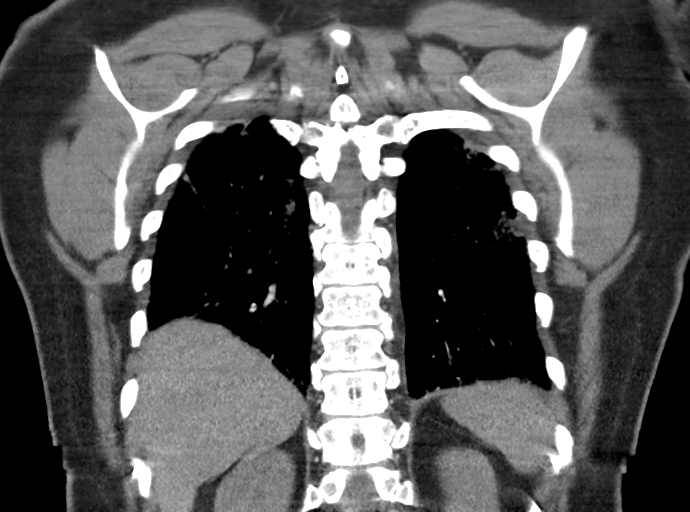

[18 of 46 positions shown; findings below may reference images not displayed]

FINDINGS: Cardiovascular: Heart is normal size. Minimal calcified plaque over
the left anterior descending coronary artery. Thoracic aorta is
normal in contrast opacification of the pulmonary arterial system is
not optimal. There is no pulmonary emboli identified.

Mediastinum/Nodes: No mediastinal or hilar adenopathy. Remaining
mediastinal structures are unremarkable.

Lungs/Pleura: Exam demonstrates a bilateral multifocal predominantly
peripheral airspace process worse over the mid to upper lungs.
Findings likely due to multifocal pneumonia and fairly typical in
distribution for FXURI-ZA pneumonia. No effusion. Airways are
normal.

Upper Abdomen: No acute findings. 1 cm hypodensity over the mid to
upper pole left kidney too small to characterize but likely a cyst.

Musculoskeletal: No acute findings.

Review of the MIP images confirms the above findings.
IMPRESSION: 1. No evidence of pulmonary embolism.
2. Bilateral multifocal airspace process worse over the mid to upper
lungs likely due to multifocal pneumonia of viral origin in this
FXURI-ZA positive patient.
3. 1 cm left renal hypodensity too small to characterize but likely
a cyst.

## 2022-03-13 ENCOUNTER — Other Ambulatory Visit: Payer: Self-pay

## 2022-03-13 ENCOUNTER — Emergency Department: Payer: 59

## 2022-03-13 ENCOUNTER — Emergency Department
Admission: EM | Admit: 2022-03-13 | Discharge: 2022-03-13 | Disposition: A | Discharge status: Home / Self Care | Payer: 59 | Attending: Emergency Medicine | Admitting: Emergency Medicine

## 2022-03-13 DIAGNOSIS — S60221A Contusion of right hand, initial encounter: Secondary | ICD-10-CM | POA: Diagnosis not present

## 2022-03-13 DIAGNOSIS — S6991XA Unspecified injury of right wrist, hand and finger(s), initial encounter: Secondary | ICD-10-CM | POA: Diagnosis present

## 2022-03-13 DIAGNOSIS — W230XXA Caught, crushed, jammed, or pinched between moving objects, initial encounter: Secondary | ICD-10-CM | POA: Diagnosis not present

## 2022-03-13 DIAGNOSIS — Z23 Encounter for immunization: Secondary | ICD-10-CM | POA: Insufficient documentation

## 2022-03-13 MED ORDER — IBUPROFEN 600 MG PO TABS
600.0000 mg | ORAL_TABLET | Freq: Three times a day (TID) | ORAL | 0 refills | Status: AC | PRN
Start: 2022-03-13 — End: ?

## 2022-03-13 MED ORDER — HYDROCODONE-ACETAMINOPHEN 5-325 MG PO TABS: 1.0000 | ORAL_TABLET | Freq: Four times a day (QID) | ORAL | 0 refills | Status: AC | PRN

## 2022-03-13 MED ORDER — HYDROCODONE-ACETAMINOPHEN 5-325 MG PO TABS
2.0000 | ORAL_TABLET | Freq: Once | ORAL | Status: AC
  Administered 2022-03-13: 2 via ORAL
  Filled 2022-03-13: qty 2

## 2022-03-13 MED ORDER — TETANUS-DIPHTH-ACELL PERTUSSIS 5-2.5-18.5 LF-MCG/0.5 IM SUSY
0.5000 mL | PREFILLED_SYRINGE | Freq: Once | INTRAMUSCULAR | Status: AC
  Administered 2022-03-13: 0.5 mL via INTRAMUSCULAR
  Filled 2022-03-13: qty 0.5

## 2022-03-13 MED ORDER — ONDANSETRON 4 MG PO TBDP: 4.0000 mg | ORAL_TABLET | Freq: Three times a day (TID) | ORAL | 0 refills | Status: AC | PRN

## 2022-03-13 MED ORDER — CEPHALEXIN 500 MG PO CAPS: 500.0000 mg | ORAL_CAPSULE | Freq: Three times a day (TID) | ORAL | 0 refills | Status: AC

## 2022-03-13 NOTE — ED Triage Notes (Signed)
Pt comes in from home. Pt got her right hand caught in a fence puller. Pt has swelling, discoloration to right hand. Pt advised she did pass out. She passed out afterwards due to pain, denies any fall and trauma to the head. Pt is in no acute distress at this time and no other complaints.

## 2022-03-13 NOTE — ED Notes (Signed)
Pt is soaking puncture site in mixture of NS and iodine per Erma Heritage, MD.

## 2022-03-13 NOTE — ED Provider Notes (Signed)
[facility] Provider Note    Event Date/Time   First MD Initiated Contact with Patient 03/13/22 1728     (approximate)   History   Hand Injury   HPI  Audrey Stewart is a 56 y.o. female  here with hand pain. Pt was using a fence puller today when she lost control and her hand struck it, causing immediate pain to the thumb and radial aspect of the wrist. She also sustained a puncture wound to the dorsum of the hand. She is right hand dominant. No distal numbness, weakness. She has a remote h/o thumb injury but no chronic pain there. No other injuries.       Physical Exam   Triage Vital Signs: ED Triage Vitals  Enc Vitals Group     BP 03/13/22 1624 (!) 181/97     Pulse Rate 03/13/22 1624 85     Resp 03/13/22 1624 16     Temp 03/13/22 1624 98.4 F (36.9 C)     Temp Source 03/13/22 1624 Oral     SpO2 03/13/22 1624 96 %     Weight 03/13/22 1625 163 lb (73.9 kg)     Height 03/13/22 1625 5\' 7"  (1.702 m)     Head Circumference --      Peak Flow --      Pain Score 03/13/22 1625 8     Pain Loc --      Pain Edu? --      Excl. in GC? --     Most recent vital signs: Vitals:   03/13/22 1624  BP: (!) 181/97  Pulse: 85  Resp: 16  Temp: 98.4 F (36.9 C)  SpO2: 96%     General: Awake, no distress.  CV:  Good peripheral perfusion.  Resp:  Normal effort.  Abd:  No distention.  Other:   UPPER EXTREMITY EXAM: RIGHT  INSPECTION & PALPATION: Moderate TTP over medial thumb and lateral wrist with significant tenderness to palpation, no deformity. Puncture wound over dorsum of hand, no active bleeding. No open drainage.   SENSORY: Sensation is intact to light touch in:  Superficial radial nerve distribution (dorsal first web space) Median nerve distribution (tip of index finger)   Ulnar nerve distribution (tip of small finger)     MOTOR:  + Motor posterior interosseous nerve (thumb IP extension) + Anterior interosseous nerve (thumb IP flexion,  index finger DIP flexion) + Radial nerve (wrist extension) + Median nerve (palpable firing thenar mass) + Ulnar nerve (palpable firing of first dorsal interosseous muscle)  VASCULAR: 2+ radial pulse Brisk capillary refill < 2 sec, fingers warm and well-perfused   COMPARTMENTS: Soft, warm, well-perfused No pain with passive extension No paresthesias    ED Results / Procedures / Treatments   Labs (all labs ordered are listed, but only abnormal results are displayed) Labs Reviewed - No data to display   EKG    RADIOLOGY DG Hand Right: negative   I also independently reviewed and agree with radiologist interpretations.   PROCEDURES:  Critical Care performed: No   MEDICATIONS ORDERED IN ED: Medications  Tdap (BOOSTRIX) injection 0.5 mL (0.5 mLs Intramuscular Given 03/13/22 1826)  HYDROcodone-acetaminophen (NORCO/VICODIN) 5-325 MG per tablet 2 tablet (2 tablets Oral Given 03/13/22 1825)     IMPRESSION / MDM / ASSESSMENT AND PLAN / ED COURSE  I reviewed the triage vital signs and the nursing notes.  This patient presents to the ED for concern of hand injury, this involves an extensive number of treatment options, and is a complaint that carries with it a high risk of complications and morbidity.  The differential diagnosis includes: fx, contusion, compartment syndrome, puncture wound, retained FB, occult scaphoid injury, sprain/strain   Co morbidities that complicate the patient evaluation  None   Imaging Studies ordered:  I ordered imaging studies including plain films of hand  I independently visualized and interpreted imaging which showed: No fracture I agree with the radiologist interpretation   Problem List / ED Course / Critical interventions / Medication management  Hand injury Crush injury to hand with small puncture wound. Wound cleaned and tetanus updated, will start empiric ABX. Re: areas of pain - imaging shows  no acute fx. Compartments are soft with some swelling but no tightness, no pain with pROM. No distal numbness or weakness. Tendons intact. Will place in SPICA splint given ongoing snuffbox TTP, advise NSAIDs and repeat film in 1 week. Return precautions given. I ordered medication including TDap for prophylaxis  I have reviewed the patients home medicines and have made adjustments as needed   Social Determinants of Health:  No major issues, right hand dominant   Test / Admission - Considered:  No indication for admission at this time   FINAL CLINICAL IMPRESSION(S) / ED DIAGNOSES   Final diagnoses:  Contusion of right hand, initial encounter     Rx / DC Orders   ED Discharge Orders          Ordered    HYDROcodone-acetaminophen (NORCO/VICODIN) 5-325 MG tablet  Every 6 hours PRN        03/13/22 1855    ondansetron (ZOFRAN-ODT) 4 MG disintegrating tablet  Every 8 hours PRN        03/13/22 1855    ibuprofen (ADVIL) 600 MG tablet  Every 8 hours PRN        03/13/22 1855    cephALEXin (KEFLEX) 500 MG capsule  3 times daily        03/13/22 1856             Note:  This document was prepared using Dragon voice recognition software and may include unintentional dictation errors.   Shaune Pollack, MD 03/13/22 2001

## 2022-03-13 NOTE — Discharge Instructions (Signed)
Wear the splint as often as possible for the next week  Follow-up with your primary doctor in 1 week for repeat XRays, as there can occasionally be fractures that do not show up initially.  Take the medications for pain  Take the antibiotics to prevent infection

## 2023-08-15 IMAGING — CR DG CHEST 2V
1 series · 2 of 2 positions shown · non-contrast
Comparison: Chest x-ray dated January 20, 2020

CLINICAL DATA: Chest pain

EXAM:
CHEST - 2 VIEW

[Series 1: dg chest 2 view · 0.14mm/px · 2 of 2 slices shown]
[im 1/2]
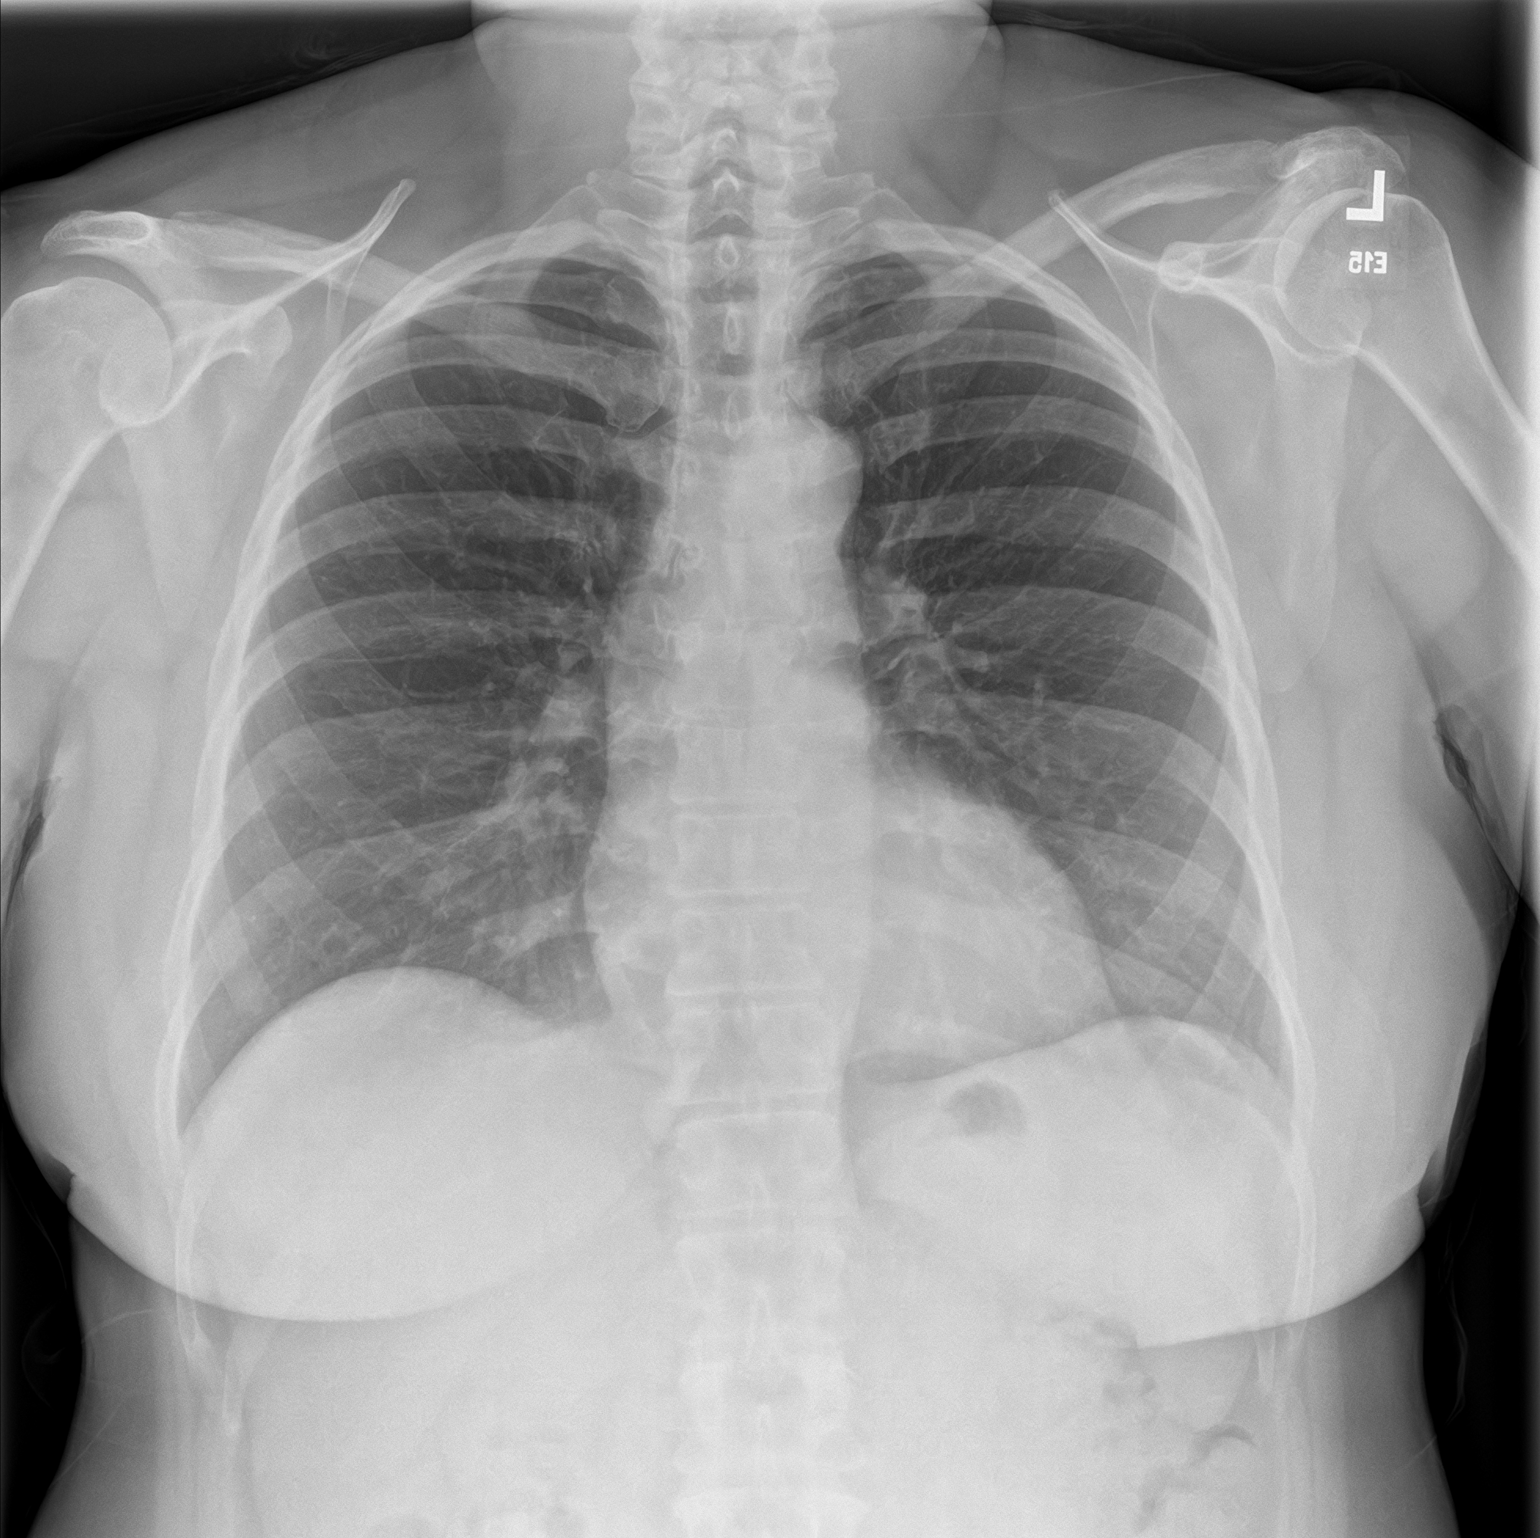
[im 2/2]
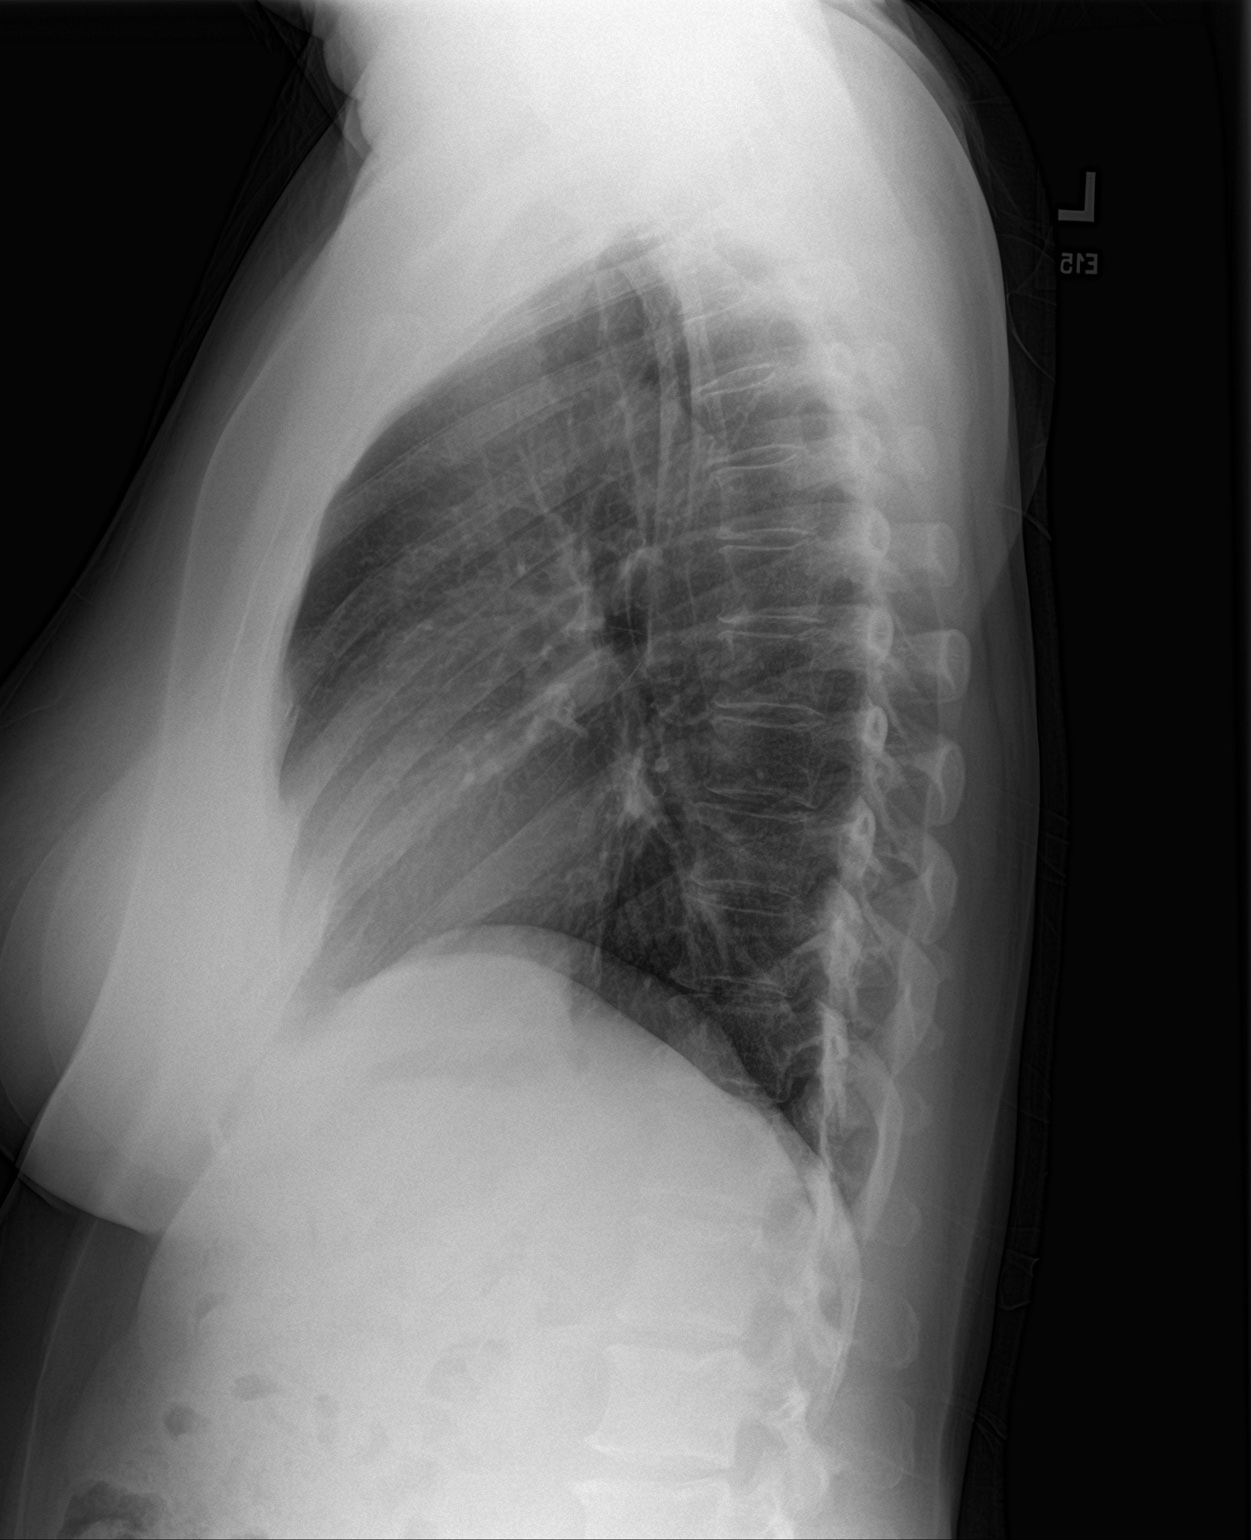

[2 of 2 positions shown; findings below may reference images not displayed]

FINDINGS: The heart size and mediastinal contours are within normal limits.
Both lungs are clear. The visualized skeletal structures are
unremarkable.
IMPRESSION: No active cardiopulmonary disease.

## 2023-09-28 ENCOUNTER — Other Ambulatory Visit: Payer: Self-pay | Admitting: Family Medicine

## 2023-09-28 DIAGNOSIS — R0789 Other chest pain: Secondary | ICD-10-CM

## 2023-09-28 DIAGNOSIS — R0602 Shortness of breath: Secondary | ICD-10-CM

## 2023-09-28 DIAGNOSIS — I159 Secondary hypertension, unspecified: Secondary | ICD-10-CM

## 2023-10-05 ENCOUNTER — Ambulatory Visit
Admission: RE | Admit: 2023-10-05 | Discharge: 2023-10-05 | Disposition: A | Discharge status: Home / Self Care | Payer: Self-pay | Source: Ambulatory Visit | Attending: Family Medicine | Admitting: Family Medicine

## 2023-10-05 DIAGNOSIS — R0602 Shortness of breath: Secondary | ICD-10-CM | POA: Insufficient documentation

## 2023-10-05 DIAGNOSIS — I159 Secondary hypertension, unspecified: Secondary | ICD-10-CM | POA: Insufficient documentation

## 2023-10-05 DIAGNOSIS — R0789 Other chest pain: Secondary | ICD-10-CM | POA: Insufficient documentation
